# Patient Record
Sex: Male | Born: 1965 | Race: White | Hispanic: No | Marital: Married | Smoking: Never smoker
Health system: Southern US, Community
[De-identification: ages and names within clinical notes are randomized; demographics above are authoritative.]

---

## 1999-07-26 ENCOUNTER — Emergency Department (HOSPITAL_COMMUNITY): Admission: EM | Admit: 1999-07-26 | Discharge: 1999-07-26 | Payer: Self-pay | Admitting: Emergency Medicine

## 2000-09-25 ENCOUNTER — Emergency Department (HOSPITAL_COMMUNITY): Admission: EM | Admit: 2000-09-25 | Discharge: 2000-09-25 | Payer: Self-pay | Admitting: Emergency Medicine

## 2001-06-18 ENCOUNTER — Emergency Department (HOSPITAL_COMMUNITY): Admission: EM | Admit: 2001-06-18 | Discharge: 2001-06-18 | Payer: Self-pay | Admitting: Emergency Medicine

## 2007-08-11 ENCOUNTER — Emergency Department (HOSPITAL_COMMUNITY): Admission: EM | Admit: 2007-08-11 | Discharge: 2007-08-11 | Payer: Self-pay | Admitting: Emergency Medicine

## 2015-08-09 ENCOUNTER — Emergency Department (INDEPENDENT_AMBULATORY_CARE_PROVIDER_SITE_OTHER)
Admission: EM | Admit: 2015-08-09 | Discharge: 2015-08-09 | Disposition: A | Discharge status: Home / Self Care | Payer: Self-pay | Source: Home / Self Care | Attending: Emergency Medicine | Admitting: Emergency Medicine

## 2015-08-09 ENCOUNTER — Encounter (HOSPITAL_COMMUNITY): Payer: Self-pay | Admitting: Emergency Medicine

## 2015-08-09 ENCOUNTER — Emergency Department (INDEPENDENT_AMBULATORY_CARE_PROVIDER_SITE_OTHER): Payer: Self-pay

## 2015-08-09 DIAGNOSIS — J40 Bronchitis, not specified as acute or chronic: Secondary | ICD-10-CM

## 2015-08-09 MED ORDER — BENZONATATE 100 MG PO CAPS: 100.0000 mg | ORAL_CAPSULE | Freq: Three times a day (TID) | ORAL | Status: AC

## 2015-08-09 MED ORDER — BENZONATATE 100 MG PO CAPS: 100.0000 mg | ORAL_CAPSULE | Freq: Three times a day (TID) | ORAL | Status: DC

## 2015-08-09 MED ORDER — AZITHROMYCIN 250 MG PO TABS: 250.0000 mg | ORAL_TABLET | Freq: Every day | ORAL | Status: AC

## 2015-08-09 MED ORDER — AZITHROMYCIN 250 MG PO TABS: 250.0000 mg | ORAL_TABLET | Freq: Every day | ORAL | Status: DC

## 2015-08-09 NOTE — Discharge Instructions (Signed)

## 2015-08-09 NOTE — ED Provider Notes (Signed)
CSN: [id number]     Arrival date & time 08/09/15  1301 History   First MD Initiated Contact with Patient 08/09/15 1322     Chief Complaint  Patient presents with  . Cough   (Consider location/radiation/quality/duration/timing/severity/associated sxs/prior Treatment) Patient is a 50 y.o. male presenting with cough. The history is provided by the patient. No language interpreter was used.  Cough Cough characteristics:  Productive Sputum characteristics:  Nondescript Severity:  Moderate Onset quality:  Gradual Timing:  Constant Progression:  Worsening Chronicity:  New Smoker: no   Context: not sick contacts   Relieved by:  Nothing Worsened by:  Nothing tried Ineffective treatments:  None tried Associated symptoms: shortness of breath   Risk factors: no recent infection   Pt complains of a cough since mid December.  Pt reports he coughed up blood today.   History reviewed. No pertinent past medical history. History reviewed. No pertinent past surgical history. No family history on file. Social History  Substance Use Topics  . Smoking status: Never Smoker   . Smokeless tobacco: None  . Alcohol Use: No    Review of Systems  Respiratory: Positive for cough and shortness of breath.   All other systems reviewed and are negative.   Allergies  Review of patient's allergies indicates no known allergies.  Home Medications   Prior to Admission medications   Medication Sig Start Date End Date Taking? Authorizing Provider  diphenhydramine-acetaminophen (TYLENOL PM) 25-500 MG TABS tablet Take 1 tablet by mouth at bedtime as needed.   Yes Historical Provider, MD  Pseudoeph-Doxylamine-DM-APAP (NYQUIL PO) Take by mouth.   Yes Historical Provider, MD   Meds Ordered and Administered this Visit  Medications - No data to display  BP 141/91 mmHg  Pulse 79  Temp(Src) 98.1 F (36.7 C) (Oral)  Resp 16  SpO2 98% No data found.   Physical Exam  Constitutional: He is oriented to  person, place, and time. He appears well-developed and well-nourished.  HENT:  Head: Normocephalic and atraumatic.  Eyes: Conjunctivae and EOM are normal. Pupils are equal, round, and reactive to light.  Neck: Normal range of motion.  Cardiovascular: Normal rate.   Pulmonary/Chest: Effort normal.  Abdominal: Soft.  Musculoskeletal: Normal range of motion.  Neurological: He is alert and oriented to person, place, and time. He has normal reflexes.  Skin: Skin is warm.  Psychiatric: He has a normal mood and affect.  Nursing note and vitals reviewed.   ED Course  Procedures (including critical care time)  Labs Review Labs Reviewed - No data to display  Imaging Review Dg Chest 2 View  08/09/2015  CLINICAL DATA:  Cough. EXAM: CHEST  2 VIEW COMPARISON:  None. FINDINGS: Normal heart size and mediastinal contours. No acute infiltrate or edema. No effusion or pneumothorax. No acute osseous findings. IMPRESSION: Negative chest. Electronically Signed   By: Marnee SpringJonathon  Watts M.D.   On: 08/09/2015 14:08     Visual Acuity Review  Right Eye Distance:   Left Eye Distance:   Bilateral Distance:    Right Eye Near:   Left Eye Near:    Bilateral Near:         MDM   1. Bronchitis    zithromax Tessalon     Elson AreasLeslie K Sofia, [place] JerseyPA-C 08/09/15 1458

## 2015-08-09 NOTE — ED Notes (Signed)
Patient transported to X-ray 

## 2015-08-09 NOTE — ED Notes (Signed)
Cough and pain between shoulder blades.  Coughed up bloody mucus this morning.  Patient has had this since 12/17.  Random fevers.

## 2016-10-02 IMAGING — DX DG CHEST 2V
2 series · 2 of 2 positions shown · non-contrast
Comparison: None.

CLINICAL DATA: Cough.

EXAM:
CHEST  2 VIEW

[chest pa]
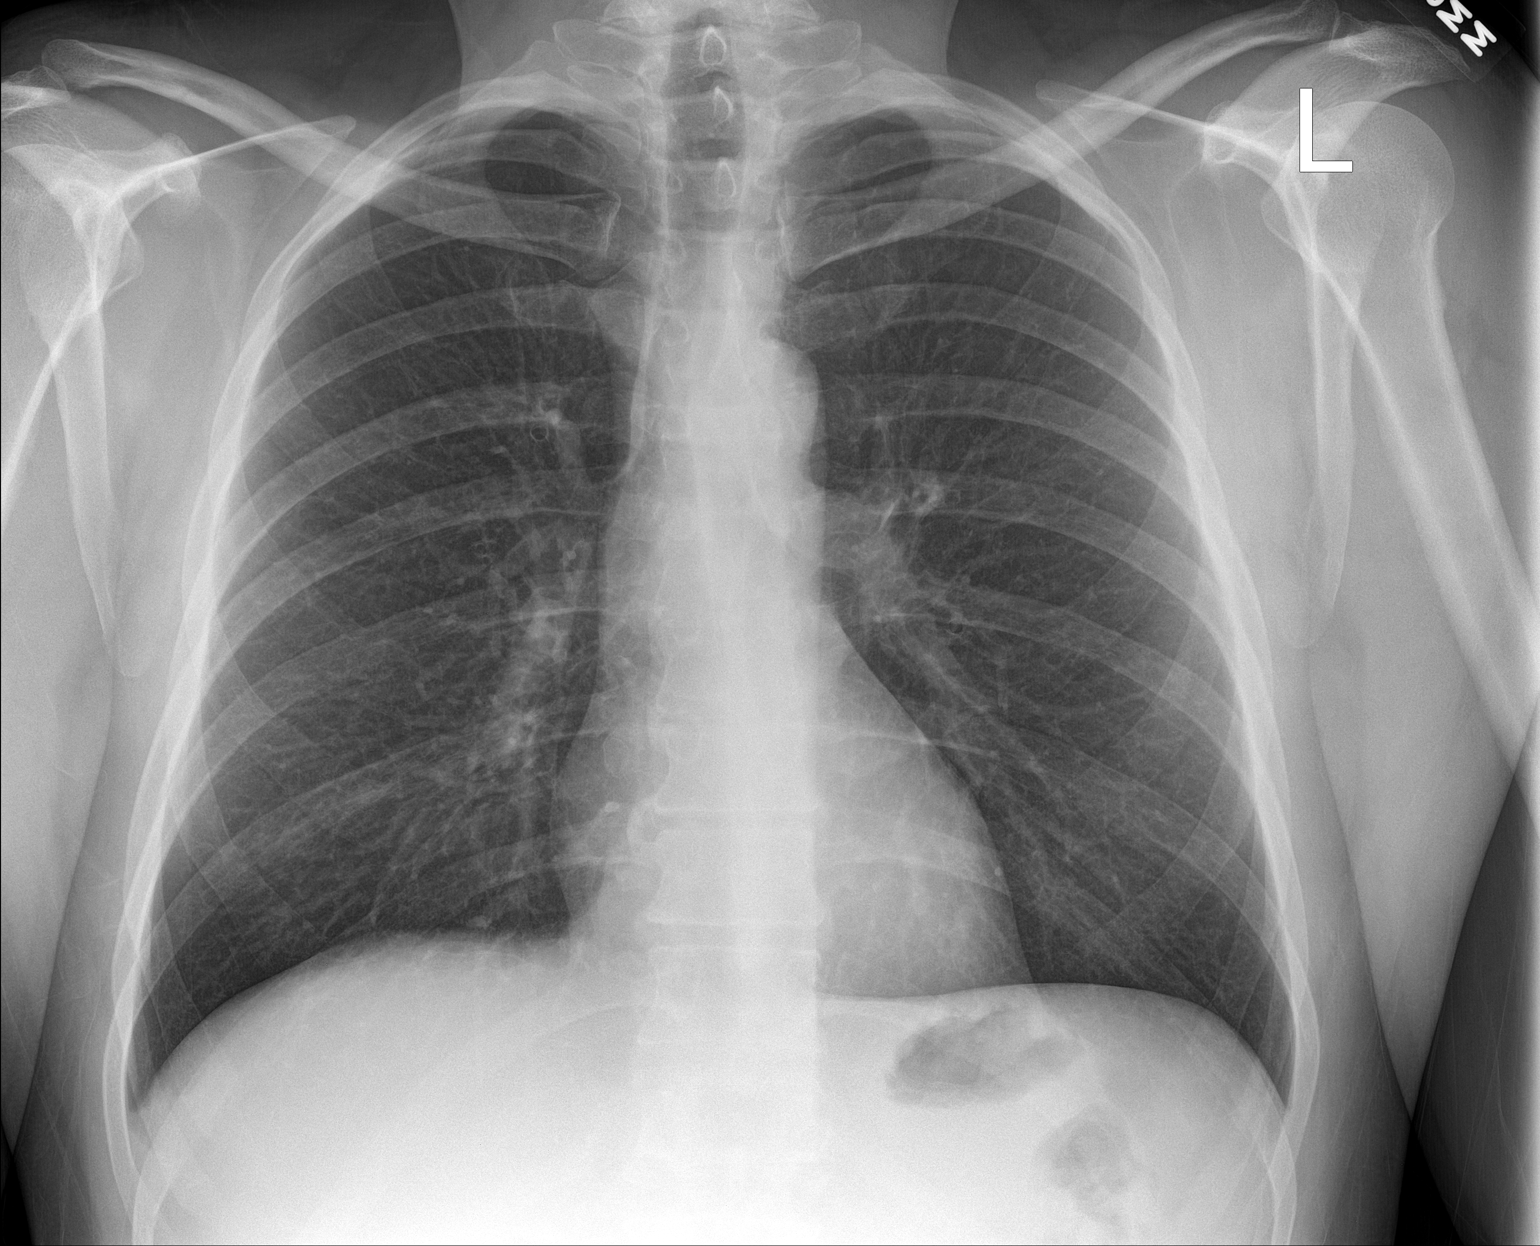

[chest lat]
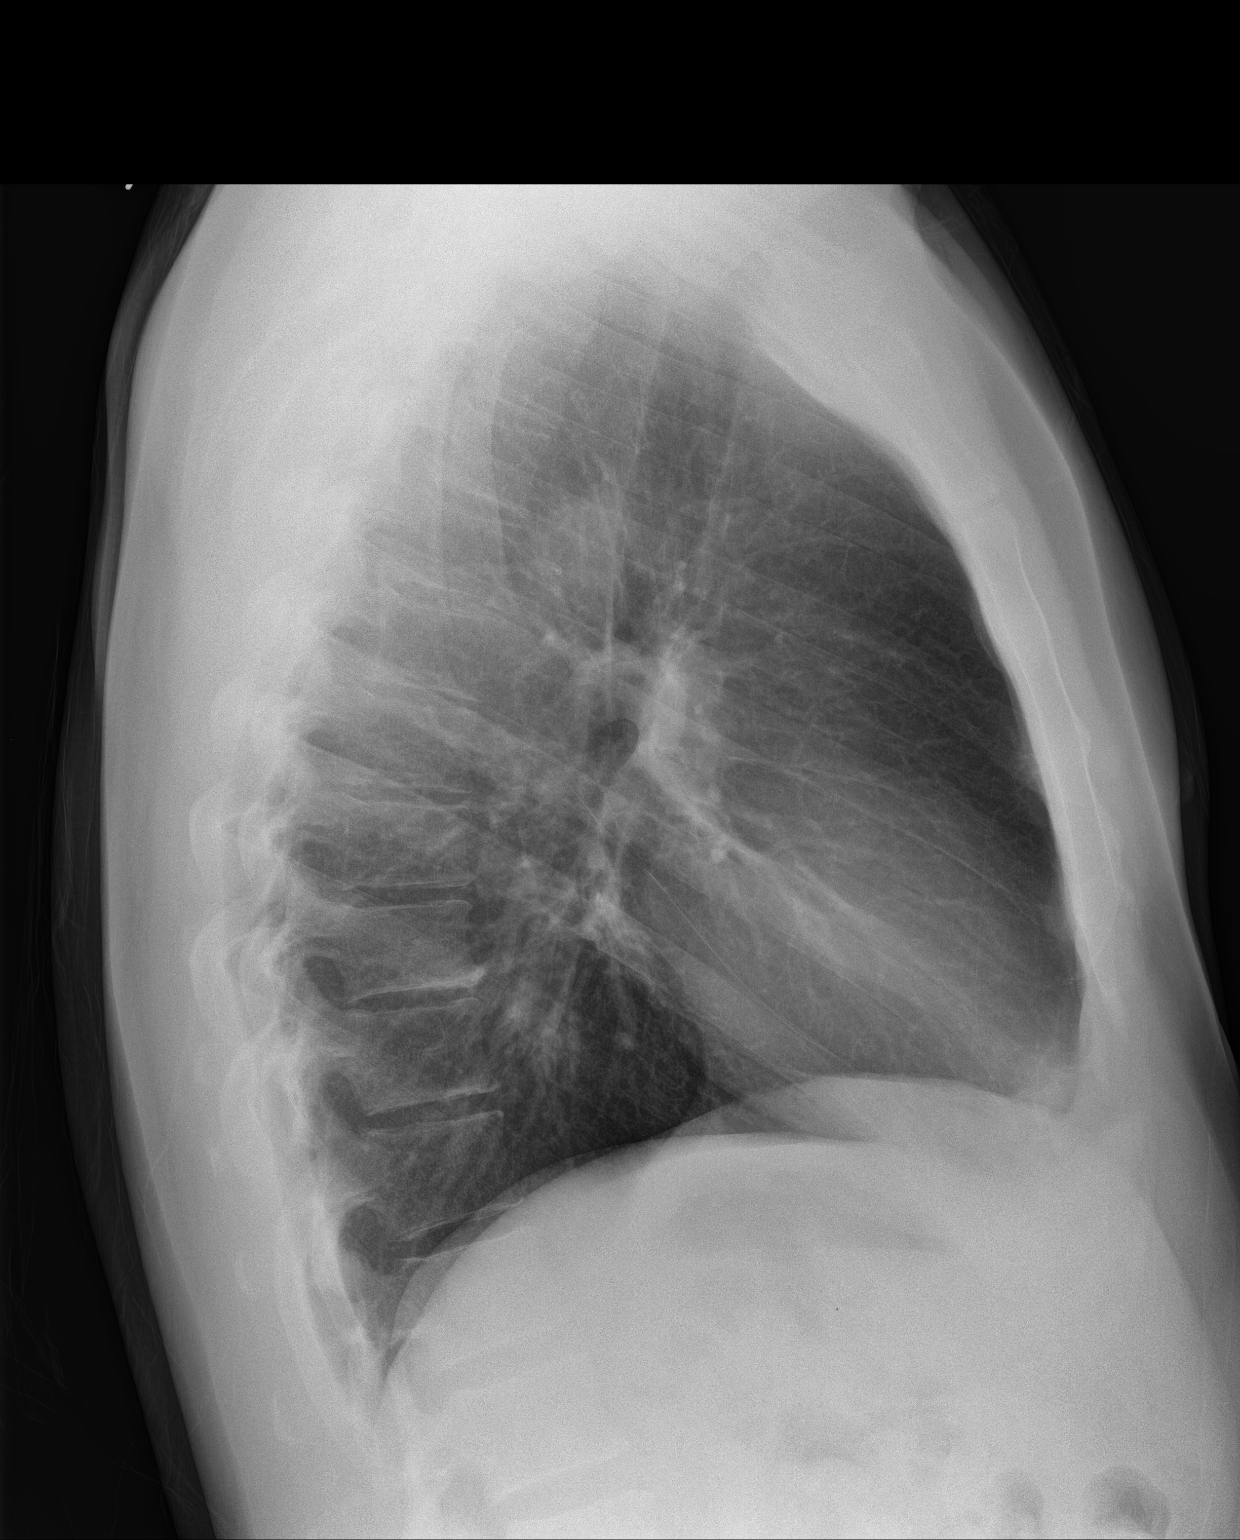

[2 of 2 positions shown; findings below may reference images not displayed]

FINDINGS: Normal heart size and mediastinal contours. No acute infiltrate or
edema. No effusion or pneumothorax. No acute osseous findings.
IMPRESSION: Negative chest.
